# Patient Record
Sex: Male | Born: 1965 | Hispanic: No | Marital: Single | Smoking: Never smoker
Health system: Southern US, Community
[De-identification: ages and names within clinical notes are randomized; demographics above are authoritative.]

---

## 2015-09-14 ENCOUNTER — Encounter: Payer: Self-pay | Admitting: Emergency Medicine

## 2015-09-14 DIAGNOSIS — J0510 Acute epiglottitis without obstruction: Secondary | ICD-10-CM | POA: Diagnosis present

## 2015-09-14 DIAGNOSIS — R1319 Other dysphagia: Secondary | ICD-10-CM | POA: Diagnosis present

## 2015-09-14 DIAGNOSIS — B950 Streptococcus, group A, as the cause of diseases classified elsewhere: Secondary | ICD-10-CM | POA: Diagnosis present

## 2015-09-14 DIAGNOSIS — A419 Sepsis, unspecified organism: Principal | ICD-10-CM | POA: Diagnosis present

## 2015-09-14 DIAGNOSIS — Z8249 Family history of ischemic heart disease and other diseases of the circulatory system: Secondary | ICD-10-CM

## 2015-09-14 LAB — POCT RAPID STREP A: STREPTOCOCCUS, GROUP A SCREEN (DIRECT): NEGATIVE

## 2015-09-14 MED ORDER — ACETAMINOPHEN 500 MG PO TABS
ORAL_TABLET | ORAL | Status: AC
  Administered 2015-09-14: 1000 mg via ORAL
  Filled 2015-09-14: qty 2

## 2015-09-14 MED ORDER — ACETAMINOPHEN 500 MG PO TABS
1000.0000 mg | ORAL_TABLET | Freq: Once | ORAL | Status: AC
  Administered 2015-09-14: 1000 mg via ORAL

## 2015-09-14 NOTE — ED Notes (Signed)
Pt presents to ED with sore throat, hoarse voice, swelling to his tonsils. Pt states his tonsils are so enlarged and painful he feels like he can't swallow. Decrease in oral intake since onset of symptoms. Pt gagging and vomiting in triage. Pt states vomiting is due to the size of his tonsils and not due to stomach discomfort. Voice difficulty to understand. Also reports fever, body aches, and headache.

## 2015-09-15 ENCOUNTER — Encounter: Payer: Self-pay | Admitting: Radiology

## 2015-09-15 ENCOUNTER — Inpatient Hospital Stay
Admission: EM | Admit: 2015-09-15 | Discharge: 2015-09-17 | DRG: 872 | Disposition: A | Discharge status: Home / Self Care | Payer: Medicaid - Out of State | Attending: Internal Medicine | Admitting: Internal Medicine

## 2015-09-15 ENCOUNTER — Emergency Department: Payer: Medicaid - Out of State

## 2015-09-15 DIAGNOSIS — J0510 Acute epiglottitis without obstruction: Secondary | ICD-10-CM | POA: Diagnosis present

## 2015-09-15 DIAGNOSIS — A419 Sepsis, unspecified organism: Secondary | ICD-10-CM

## 2015-09-15 DIAGNOSIS — R1319 Other dysphagia: Secondary | ICD-10-CM | POA: Diagnosis present

## 2015-09-15 DIAGNOSIS — B950 Streptococcus, group A, as the cause of diseases classified elsewhere: Secondary | ICD-10-CM | POA: Diagnosis present

## 2015-09-15 DIAGNOSIS — Z8249 Family history of ischemic heart disease and other diseases of the circulatory system: Secondary | ICD-10-CM | POA: Diagnosis not present

## 2015-09-15 LAB — COMPREHENSIVE METABOLIC PANEL
ALBUMIN: 3.8 g/dL (ref 3.5–5.0)
ALT: 54 U/L (ref 17–63)
AST: 43 U/L — AB (ref 15–41)
Alkaline Phosphatase: 104 U/L (ref 38–126)
Anion gap: 10 (ref 5–15)
BUN: 17 mg/dL (ref 6–20)
CHLORIDE: 103 mmol/L (ref 101–111)
CO2: 23 mmol/L (ref 22–32)
Calcium: 8.8 mg/dL — ABNORMAL LOW (ref 8.9–10.3)
Creatinine, Ser: 0.99 mg/dL (ref 0.61–1.24)
GFR calc Af Amer: >60 mL/min (ref >60)
Glucose, Bld: 146 mg/dL — ABNORMAL HIGH (ref 65–99)
Potassium: 3.3 mmol/L — ABNORMAL LOW (ref 3.5–5.1)
Sodium: 136 mmol/L (ref 135–145)
Total Bilirubin: 2.3 mg/dL — ABNORMAL HIGH (ref 0.3–1.2)
Total Protein: 7.8 g/dL (ref 6.5–8.1)

## 2015-09-15 LAB — RAPID INFLUENZA A&B ANTIGENS (ARMC ONLY)
INFLUENZA A (ARMC): NOT DETECTED
INFLUENZA B (ARMC): NOT DETECTED

## 2015-09-15 LAB — CBC WITH DIFFERENTIAL/PLATELET
BASOS ABS: 0 10*3/uL (ref 0–0.1)
BASOS PCT: 0 %
EOS ABS: 0 10*3/uL (ref 0–0.7)
EOS PCT: 0 %
HCT: 42.9 % (ref 40.0–52.0)
Hemoglobin: 14.8 g/dL (ref 13.0–18.0)
LYMPHS PCT: 4 %
Lymphs Abs: 0.5 10*3/uL — ABNORMAL LOW (ref 1.0–3.6)
MCH: 29.6 pg (ref 26.0–34.0)
MCHC: 34.5 g/dL (ref 32.0–36.0)
MCV: 85.7 fL (ref 80.0–100.0)
Monocytes Absolute: 1.2 10*3/uL — ABNORMAL HIGH (ref 0.2–1.0)
Monocytes Relative: 8 %
Neutro Abs: 13.5 10*3/uL — ABNORMAL HIGH (ref 1.4–6.5)
Neutrophils Relative %: 88 %
PLATELETS: 190 10*3/uL (ref 150–440)
RBC: 5 MIL/uL (ref 4.40–5.90)
RDW: 13.4 % (ref 11.5–14.5)
WBC: 15.2 10*3/uL — AB (ref 3.8–10.6)

## 2015-09-15 LAB — LACTIC ACID, PLASMA: LACTIC ACID, VENOUS: 1.6 mmol/L (ref 0.5–2.0)

## 2015-09-15 LAB — SEDIMENTATION RATE: SED RATE: 53 mm/h — AB (ref 0–15)

## 2015-09-15 LAB — LIPASE, BLOOD: Lipase: 21 U/L (ref 11–51)

## 2015-09-15 MED ORDER — SODIUM CHLORIDE 0.9 % IV SOLN
3.0000 g | INTRAVENOUS | Status: AC
  Administered 2015-09-15: 3 g via INTRAVENOUS
  Filled 2015-09-15: qty 3

## 2015-09-15 MED ORDER — RACEPINEPHRINE HCL 2.25 % IN NEBU
INHALATION_SOLUTION | RESPIRATORY_TRACT | Status: AC
  Filled 2015-09-15: qty 0.5

## 2015-09-15 MED ORDER — ONDANSETRON HCL 4 MG PO TABS: 4.0000 mg | ORAL_TABLET | Freq: Four times a day (QID) | ORAL | Status: DC | PRN

## 2015-09-15 MED ORDER — SODIUM CHLORIDE 0.9 % IV BOLUS (SEPSIS)
2000.0000 mL | INTRAVENOUS | Status: AC
Start: 2015-09-15 — End: 2015-09-15
  Administered 2015-09-15: 2000 mL via INTRAVENOUS

## 2015-09-15 MED ORDER — INFLUENZA VAC SPLIT QUAD 0.5 ML IM SUSY
0.5000 mL | PREFILLED_SYRINGE | INTRAMUSCULAR | Status: AC
  Administered 2015-09-16: 0.5 mL via INTRAMUSCULAR
  Filled 2015-09-15: qty 0.5

## 2015-09-15 MED ORDER — HYDROCODONE-ACETAMINOPHEN 5-325 MG PO TABS
1.0000 | ORAL_TABLET | ORAL | Status: DC | PRN
  Filled 2015-09-15: qty 2

## 2015-09-15 MED ORDER — ENOXAPARIN SODIUM 40 MG/0.4ML ~~LOC~~ SOLN
40.0000 mg | SUBCUTANEOUS | Status: DC
  Administered 2015-09-15 – 2015-09-16 (×2): 40 mg via SUBCUTANEOUS
  Filled 2015-09-15 (×2): qty 0.4

## 2015-09-15 MED ORDER — DEXAMETHASONE SODIUM PHOSPHATE 10 MG/ML IJ SOLN
INTRAMUSCULAR | Status: AC
  Administered 2015-09-15: 10 mg via INTRAVENOUS
  Filled 2015-09-15: qty 1

## 2015-09-15 MED ORDER — ACETAMINOPHEN 500 MG PO TABS
1000.0000 mg | ORAL_TABLET | Freq: Once | ORAL | Status: AC
  Administered 2015-09-15: 1000 mg via ORAL
  Filled 2015-09-15: qty 2

## 2015-09-15 MED ORDER — ACETAMINOPHEN 325 MG PO TABS: 650.0000 mg | ORAL_TABLET | Freq: Once | ORAL | Status: DC

## 2015-09-15 MED ORDER — SODIUM CHLORIDE 0.9 % IV SOLN
3.0000 g | Freq: Four times a day (QID) | INTRAVENOUS | Status: DC
  Administered 2015-09-15 – 2015-09-17 (×6): 3 g via INTRAVENOUS
  Filled 2015-09-15 (×9): qty 3

## 2015-09-15 MED ORDER — RACEPINEPHRINE HCL 2.25 % IN NEBU
0.5000 mL | INHALATION_SOLUTION | RESPIRATORY_TRACT | Status: AC
  Administered 2015-09-15: 0.5 mL via RESPIRATORY_TRACT
  Filled 2015-09-15: qty 0.5

## 2015-09-15 MED ORDER — POTASSIUM CHLORIDE 10 MEQ/100ML IV SOLN
10.0000 meq | INTRAVENOUS | Status: AC
  Administered 2015-09-15 (×3): 10 meq via INTRAVENOUS
  Filled 2015-09-15 (×3): qty 100

## 2015-09-15 MED ORDER — DEXAMETHASONE SODIUM PHOSPHATE 10 MG/ML IJ SOLN
10.0000 mg | Freq: Four times a day (QID) | INTRAMUSCULAR | Status: DC
  Administered 2015-09-15 – 2015-09-16 (×7): 10 mg via INTRAVENOUS
  Filled 2015-09-15 (×8): qty 1

## 2015-09-15 MED ORDER — MORPHINE SULFATE (PF) 4 MG/ML IV SOLN
4.0000 mg | Freq: Once | INTRAVENOUS | Status: AC
  Administered 2015-09-15: 4 mg via INTRAVENOUS
  Filled 2015-09-15: qty 1

## 2015-09-15 MED ORDER — BISACODYL 5 MG PO TBEC: 5.0000 mg | DELAYED_RELEASE_TABLET | Freq: Every day | ORAL | Status: DC | PRN

## 2015-09-15 MED ORDER — SODIUM CHLORIDE 0.9 % IV SOLN
Freq: Once | INTRAVENOUS | Status: AC
  Administered 2015-09-15: 12:00:00 via INTRAVENOUS

## 2015-09-15 MED ORDER — SODIUM CHLORIDE 0.9 % IV SOLN
3.0000 g | Freq: Three times a day (TID) | INTRAVENOUS | Status: DC
  Administered 2015-09-15: 3 g via INTRAVENOUS
  Filled 2015-09-15 (×3): qty 3

## 2015-09-15 MED ORDER — ACETAMINOPHEN 325 MG PO TABS
650.0000 mg | ORAL_TABLET | Freq: Four times a day (QID) | ORAL | Status: DC | PRN
  Administered 2015-09-15: 650 mg via ORAL
  Filled 2015-09-15: qty 2

## 2015-09-15 MED ORDER — SODIUM CHLORIDE 0.9 % IV SOLN
Freq: Once | INTRAVENOUS | Status: AC
  Administered 2015-09-15: 09:00:00 via INTRAVENOUS

## 2015-09-15 MED ORDER — DEXAMETHASONE SODIUM PHOSPHATE 10 MG/ML IJ SOLN
10.0000 mg | Freq: Once | INTRAMUSCULAR | Status: AC
  Administered 2015-09-15: 10 mg via INTRAVENOUS

## 2015-09-15 MED ORDER — MORPHINE SULFATE (PF) 2 MG/ML IV SOLN
2.0000 mg | INTRAVENOUS | Status: DC | PRN
  Administered 2015-09-16 (×4): 2 mg via INTRAVENOUS
  Filled 2015-09-15 (×4): qty 1

## 2015-09-15 MED ORDER — SENNOSIDES-DOCUSATE SODIUM 8.6-50 MG PO TABS: 1.0000 | ORAL_TABLET | Freq: Every evening | ORAL | Status: DC | PRN

## 2015-09-15 MED ORDER — IOHEXOL 300 MG/ML  SOLN
75.0000 mL | Freq: Once | INTRAMUSCULAR | Status: AC | PRN
  Administered 2015-09-15: 75 mL via INTRAVENOUS

## 2015-09-15 MED ORDER — ONDANSETRON HCL 4 MG/2ML IJ SOLN
4.0000 mg | Freq: Once | INTRAMUSCULAR | Status: AC
Start: 2015-09-15 — End: 2015-09-15
  Administered 2015-09-15: 4 mg via INTRAVENOUS
  Filled 2015-09-15: qty 2

## 2015-09-15 MED ORDER — ACETAMINOPHEN 650 MG RE SUPP: 650.0000 mg | Freq: Four times a day (QID) | RECTAL | Status: DC | PRN

## 2015-09-15 MED ORDER — ONDANSETRON HCL 4 MG/2ML IJ SOLN
4.0000 mg | Freq: Four times a day (QID) | INTRAMUSCULAR | Status: DC | PRN
  Administered 2015-09-16 (×2): 4 mg via INTRAVENOUS
  Filled 2015-09-15 (×2): qty 2

## 2015-09-15 NOTE — ED Notes (Signed)
ENT at bedside

## 2015-09-15 NOTE — Progress Notes (Signed)
RN spoke with pt's sister and provided an update on pt's status and plan of care. Pt's sister given password for future calls. Pt's sister will call and update pt's wife, who speaks little [place].

## 2015-09-15 NOTE — Op Note (Signed)
09/15/2015  7:41 AM    [name]  [id number]   Pre-Op Dx:  Possible Epiglottitis  Post-op Dx: Epiglottitis involving the right side of the epiglottis and aryepiglottic fold  Proc: Flexible laryngoscopy   Surg:  Adelise Buswell H  Anes:  GOT  EBL:  None  Comp:  None  Findings:  Swelling or redness on the right side epiglottis and right aryepiglottic fold with exudate in the right piriform sinus. Can see the left cord moving well and and airway there.  Procedure: The patient was given topical anesthesia by spraying the nose with phenylephrine and Xylocaine. The flexible scope was passed through the right nostril visualize the hypopharynx and larynx. He showed previous nasal and septal surgery. The nose and nasopharynx were clear. The base of tongue was clear. The epiglottis was read on the right side and going down the aryepiglottic fold. There is some yellow brown mucus line of the right piriform sinus which she continue cough up. He had good mobility of his left vocal cord to be easily seen while the right side was covered over by some swelling of the aryepiglottic fold. No sign of any air hunger or rapid breathing. No other redness or swelling noted elsewhere.  Dispo:   Patient was seen in the ER, and will be admitted to the ICU  Plan:  He is on IV antibiotics and IV Decadron and used racemic epi if necessary. His swelling should settle down and once there is no further redness the epiglottis then he can be discharged home on oral antibiotics.  Raman Featherston H  09/15/2015 7:41 AM

## 2015-09-15 NOTE — Progress Notes (Signed)
Transfer  Pt transferred to ICU for observation. Report given to RN. Pt and meds transferred in bed with transporter assistance.

## 2015-09-15 NOTE — Progress Notes (Signed)
Pharmacy Antibiotic Note  Joseph Mckinney is a 50 y.o. male admitted on 09/15/2015 with sepsis secondary to severe epiglottitis.  Pharmacy has been consulted for Unasyn dosing.  Plan: Patient currently ordered Unasyn 3 g iv q 8 hours. Renal function will accommodate dosing of 3 g iv q 6 hours so will adjust accordingly.   Pharmacy will continue to monitor and adjust per consult.   Height:  (172.7 cm) Weight: 190 lb (86.183 kg) IBW/kg (Calculated) : 68.4  Temp (24hrs), Avg:99.6 F (37.6 C), Min:97.9 F (36.6 C), Max:103 F (39.4 C)   Recent Labs Lab 09/15/15 0426  WBC 15.2*  CREATININE 0.99  LATICACIDVEN 1.6    Estimated Creatinine Clearance: 96.4 mL/min (by C-G formula based on Cr of 0.99).    No Known Allergies  Antimicrobials this admission: Unasyn 02/21  >>    Dose adjustments this admission: Unasyn dose increased 02/21  Microbiology results: 02/21 BCx: No growth, pending 02/21 Rapid flu: negative 02/20 GAS: pending    Thank you for allowing pharmacy to be a part of this patient's care.  Valentina Gu 09/15/2015 2:43 PM

## 2015-09-15 NOTE — ED Notes (Signed)
Pt returned to room  

## 2015-09-15 NOTE — ED Notes (Signed)
Pt uprite on stretcher in exam room, voice hoarse, frequent gagging and spitting; Pt reports x 2 days having sinus congestion, sore throat, fever and prod cough green sputum; taking advil without relief; resp even/unlab, lungs clear

## 2015-09-15 NOTE — ED Notes (Signed)
Plan to admit pt to hospital. Awaiting bed assignment and admission orders.

## 2015-09-15 NOTE — Progress Notes (Signed)
Joseph Mckinney, Joseph Mckinney [birth year] Enedina Finner, MD  Reason for Reevaluation: Reevaluation of his laryngeal airway now 12 hours later. Has epiglottitis.  : The patient has been watched closely during the daytime. He still is coughing up purulent sputum periodically. He feels pain is significantly improved and he is not having any shortness of breath. It still hurts to swallow but is now 6 out of 10.  Allergies: No Known Allergies  ROS: Review of systems normal other than 12 systems except per HPI.  PMH: History reviewed. No pertinent past medical history.  FH:  Family History  Problem Relation Age of Onset  . Hypertension Mother     SH:  Social History   Social History  . Marital Status: Single    Spouse Name: N/A  . Number of Children: N/A  . Years of Education: N/A   Occupational History  . Not on file.   Social History Main Topics  . Smoking status: Never Smoker   . Smokeless tobacco: Not on file  . Alcohol Use: No  . Drug Use: Not on file  . Sexual Activity: Not on file   Other Topics Concern  . Not on file   Social History Narrative    PSH: History reviewed. No pertinent past surgical history.  Physical  Exam: Patient is resting quietly in the ICU. His head is elevated. He can localize although he is in a falsetto voice because sees quite hoarse if he just tries to talk in his normal voice. Oral cavity, lips, gums, ororpharynx normal with no masses or lesions. Skin warm and dry. Nasal cavity without polyps or purulence. External nose and ears without masses or lesions. Neck supple with no masses or lesions. No lymphadenopathy palpated. Thyroid normal with no masses.  Flexible laryngoscopy was done again and dictated in detail elsewhere. Shows resolution of most of the swelling of the epiglottis although it still is quite teary red. There is a white exudate over the arytenoids on both sides. The vocal cords are slightly pink. Has good mobility of the cords and open  airway and no sign for obstruction.   A/P: Epiglottitis that is improving. He is very little risk of swelling that would cause airway obstruction at this time. he'll remain on IV antibiotics and Decadron tonight.  However, if his swallowing is improving, he potentially can be switched over to an oral prednisone taper tomorrow along with Augmentin orally. If he continues to improve then he should be able to be discharged home on Thursday morning. If he is not well enough for orals tomorrow then he probably should remain on IV medications for one more day before switching to oral meds. Continue with voice rest for now until some of the inflammation settles down. Do not plan on placing the scope in his larynx again at this point as long as he continues to improve. If there should be some change in his progress and he starts to deteriorate, then he should be evaluated again at that time by ENT. Signing off   Suella Cogar H 09/15/2015 7:15 PM

## 2015-09-15 NOTE — ED Notes (Signed)
Pt to CT via stretcher accomp by CT tech 

## 2015-09-15 NOTE — Consult Note (Signed)
Taryll, Reichenberger [id number] June 09, 1966 Loleta Rose, MD  Reason for Consult: Evaluate the larynx because of possible epiglottitis  HPI: Patient is a 50 year old [place] male who started with sore throat 5 days ago. It slowly progressed got much worse through the night. He has not been short of breath but has had MUCUS coming out from his throat that he has to cough up feels like she is retching septum. Taken ibuprofen she has not been solving the pain. Now been on any antibiotics. A CT scan of his larynx showed swelling of his epiglottis and a 7 mm airway. He is been very achy and sore throats body. Assessment was made of his airway is of potential epiglottitis to see if he needs emergent intubation.  Allergies: No Known Allergies  ROS: Review of systems normal other than 12 systems except per HPI.  PMH: History reviewed. No pertinent past medical history.  FH: No family history on file.  SH:  Social History   Social History  . Marital Status: Single    Spouse Name: N/A  . Number of Children: N/A  . Years of Education: N/A   Occupational History  . Not on file.   Social History Main Topics  . Smoking status: Never Smoker   . Smokeless tobacco: Not on file  . Alcohol Use: No  . Drug Use: Not on file  . Sexual Activity: Not on file   Other Topics Concern  . Not on file   Social History Narrative    PSH: No past surgical history on file.  Physical  Exam: Well-developed well-nourished white male in no respiratory distress. He is perspiring some. It hurts for him to swallow or cough of purulent mucus. CN 2-12 grossly intact and symmetric. Oral cavity, lips, gums, ororpharynx normal with no masses or lesions. Skin warm and dry. Nasal cavity without polyps or purulence. External nose and ears without masses or lesions.  Neck supple with no masses or lesions. No lymphadenopathy palpated.   Flexible laryngoscopy was done and dictated in detail elsewhere. This shows swelling of the right  side of the epiglottis but not the left. He has swelling in his right area epiglottic fold but does not show impending obstruction of his laryngeal airway.   A/P: Patient has adult epiglottitis that involving a portion of the subglottis but not enough to be swelling his larynx closed. He has now started on IV Decadron and IV antibiotics which should help turn this around we'll reevaluate him again in 12 hours to see if there is decreased swelling of his larynx. Once the swelling is fairly well subsided then he can be discharged home on oral antibiotics and a prednisone taper. He should remain on Decadron IV and IV antibiotics until then. He will be admitted to the ICU followed by the intensivist to make sure his airway issues did not worsen. This is been explained to the patient and discussed with Dr. Manson Passey in the emergency room   Yukie Bergeron H 09/15/2015 7:45 AM

## 2015-09-15 NOTE — Progress Notes (Signed)
Confirmed with Dr. Anne Hahn that he does in fact want the pt. to be transferred to Telemetry.

## 2015-09-15 NOTE — Op Note (Signed)
09/15/2015  7:11 PM    [name]  [id number]   Pre-Op Dx:  Epiglottitis  Post-op Dx: Epiglottitis, improved from 12 hours ago  Proc: Flexible laryngoscopy   Surg:  [name]  Anes:  GOT  EBL:  None  Comp:  None  Findings:  Nose the swelling of the epiglottis is down except for the right tip. This is still cherry-red blood is much improved. There is a whitish exudate over both arytenoids. You can now see both vocal cords and they move well but are somewhat pink. No swelling of the cords  Procedure: The nose is sprayed with 4% Xylocaine mixed with Afrin for vasoconstriction and anesthesia. The flexible scope was passed through the right nostril to visualize the hypopharynx and larynx. The nasopharynx were quite red. The hypopharynx showed the epiglottis swelling to be mostly gone with still a very red tip on the right side the epiglottis. The arytenoids had a white exudate over both of them, but were not significantly swollen. Vocal cords were pink but moved well and there was no swelling. The airway was clear.  Dispo:   Evaluated in the ICU at the bedside  Plan:  Is improving significantly with the current medications and should not need further flexible endoscopy.  [name]  09/15/2015 7:11 PM

## 2015-09-15 NOTE — H&P (Addendum)
[facility] [place] [facility]   PATIENT NAME: Joseph Mckinney    MR#:  [id number]  DATE OF BIRTH:  1966/04/20  DATE OF ADMISSION:  09/15/2015  PRIMARY CARE PHYSICIAN: No PCP Per Patient   REQUESTING/REFERRING PHYSICIAN: Dr. Manson Passey  CHIEF COMPLAINT:  Severe difficulty swallowing, throat pain, fever  HISTORY OF PRESENT ILLNESS:  Joseph Mckinney  is a 50 y.o. 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 Patient was evaluated in the emergency room and was found to have severe right epiglottitis. He is started on IV Unasyn and IV Decadron. ENT recommends patient be admitted to ICU for close monitoring first 12-18 hours in case he develops impending respiratory failure and requires intubation  PAST MEDICAL HISTORY:  History reviewed. No pertinent past medical history.  PAST SURGICAL HISTOIRY:  History reviewed. No pertinent past surgical history.  SOCIAL HISTORY:   Social History  Substance Use Topics  . Smoking status: Never Smoker   . Smokeless tobacco: Not on file  . Alcohol Use: No    FAMILY HISTORY:   Family History  Problem Relation Age of Onset  . Hypertension Mother     DRUG ALLERGIES:  No Known Allergies  REVIEW OF SYSTEMS:  Review of Systems  Constitutional: Positive for fever. Negative for chills and diaphoresis.  HENT: Positive for sore throat. Negative for congestion, ear pain, hearing loss and nosebleeds.   Eyes: Negative for blurred vision, double vision, photophobia and pain.  Respiratory: Positive for stridor. Negative for hemoptysis, sputum production and wheezing.   Cardiovascular: Negative for orthopnea, claudication and leg swelling.  Gastrointestinal: Negative for heartburn and abdominal pain.  Genitourinary: Negative for dysuria and frequency.  Musculoskeletal: Negative for back  pain, joint pain and neck pain.  Skin: Negative for rash.  Neurological: Positive for weakness. Negative for tingling, sensory change, speech change, focal weakness, seizures and headaches.  Endo/Heme/Allergies: Does not bruise/bleed easily.  Psychiatric/Behavioral: Negative for suicidal ideas, memory loss and substance abuse. The patient is not nervous/anxious.   All other systems reviewed and are negative.    MEDICATIONS AT HOME:   Prior to Admission medications   Not on File      VITAL SIGNS:  Blood pressure 128/91, pulse 100, temperature 97.9 F (36.6 C), temperature source Oral, resp. rate 17, height 5\' 8"  (1.727 m), weight 86.183 kg (190 lb), SpO2 99 %.  PHYSICAL EXAMINATION:  GENERAL:  50 y.o.-year-old patient lying in the bed with no acute distress.  EYES: Pupils equal, round, reactive to light and accommodation. No scleral icterus. Extraocular muscles intact.  HEENT: Head atraumatic, normocephalic. Oral mucosa moist. NECK:  Supple, no jugular venous distention. No thyroid enlargement, no tenderness.  LUNGS: Normal breath sounds bilaterally, no wheezing, rales,rhonchi or crepitation. No use of accessory muscles of respiration.  CARDIOVASCULAR: S1, S2 normal. No murmurs, rubs, or gallops.  ABDOMEN: Soft, nontender, nondistended. Bowel sounds present. No organomegaly or mass.  EXTREMITIES: No pedal edema, cyanosis, or clubbing.  NEUROLOGIC: Cranial nerves II through XII are intact. Muscle strength 5/5 in all extremities. Sensation intact. Gait not checked.  PSYCHIATRIC: The patient is alert and oriented x 3.  SKIN: No obvious rash, lesion, or ulcer.   LABORATORY PANEL:   CBC  Recent Labs Lab 09/15/15 0426  WBC 15.2*  HGB 14.8  HCT 42.9  PLT 190   ------------------------------------------------------------------------------------------------------------------  Chemistries   Recent Labs Lab 09/15/15 0426  NA 136  K 3.3*  CL 103  CO2 23  GLUCOSE 146*  BUN  17  CREATININE 0.99  CALCIUM 8.8*  AST 43*  ALT 54  ALKPHOS 104  BILITOT 2.3*   ------------------------------------------------------------------------------------------------------------------  Cardiac Enzymes No results for input(s): TROPONINI in the last 168 hours. ------------------------------------------------------------------------------------------------------------------  RADIOLOGY:  Ct Soft Tissue Neck W Contrast  09/15/2015  CLINICAL DATA:  Initial evaluation for severe sore throat, hoarse flow is, neck pain, fever. EXAM: CT NECK WITH CONTRAST TECHNIQUE: Multidetector CT imaging of the neck was performed using the standard protocol following the bolus administration of intravenous contrast. CONTRAST:  75mL OMNIPAQUE IOHEXOL 300 MG/ML  SOLN COMPARISON:  None available. FINDINGS: Visualized portions of the brain are normal in appearance without acute abnormality. Partially visualized orbits within normal limits. Mild-to-moderate mucosal thickening within the max O sinuses and ethmoidal air cells, likely inflammatory in nature. Visualized mastoid air cells are clear. Middle ear cavities are well pneumatized. Salivary glands including the parotid glands and submandibular glands within normal limits. Oral cavity demonstrates no acute abnormality. No acute abnormality about the dentition. Palatine tonsils limb cells within normal limits. Scattered calcified tonsilliths noted. Parapharyngeal fat grossly preserved. Nasopharynx within normal limits. There is prominent asymmetric mucosal and submucosal edema throughout the right supraglottic larynx and hypopharynx, extending from the level of the epiglottis inferiorly to the level of the false cord/laryngeal ventricle. The epiglottis itself is enlarged and edematous. Right aryepiglottic fold is markedly enlarged and edematous as well. The right piriform sinus is effaced. Edema extends into the pre epiglottic fat. There is mass effect on the  supraglottic airway which is narrowed and slightly shifted to the left. Findings are consistent with acute supraglottis/pharyngitis. Supraglottic airway measures approximately 7 mm at its most narrow point. Minimal mass effect on the right true cord due to the inflammatory changes. True cords are otherwise within normal limits. The subglottic airway is widely clear. No drainable fluid collection or abscess. Thyroid gland normal. Mildly enlarged right level 2 lymph nodes measure up to 14 mm, likely reactive. Right level 3 node measures at the upper limits of normal at 1 cm. Mild inflammatory stranding along the jugular chain due to lateral extension from the inflammatory process within the pharynx. Visualized superior mediastinum demonstrates no acute abnormality. Layering debris within the visualized upper esophagus. Aberrant right subclavian artery noted. Normal intravascular enhancement seen throughout the neck. No acute osseous abnormality. No worrisome lytic or blastic osseous lesion. Visualized lungs are clear. IMPRESSION: 1. Extensive edema involving the epiglottis, right aryepiglottic fold, and right supraglottic larynx and hypopharynx as above, consistent with acute supraglottis/pharyngitis. Secondary mass effect on the or pharyngeal airway which is narrowed to 7 mm at its most narrow point. No abscess or drainable fluid collection identified. 2. Mildly enlarged right-sided cervical adenopathy, likely reactive. 3. Aberrant right subclavian artery. 4. Layering debris within the upper esophageal lumen. Finding suggests this patient may be at risk for aspiration. 5. Mild-to-moderate inflammatory paranasal sinus disease as above. Results were called by telephone at the time of interpretation on 09/15/2015 at 6:25 am to the charge nurse in the emergency room, Devoria Albe RN who verbally acknowledged these results. Electronically Signed   By: Rise Mu M.D.   On: 09/15/2015 06:28    EKG:     IMPRESSION AND PLAN:   Joseph Mckinney  is a 50 y.o. 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 Patient was evaluated in the emergency room and was found to have severe right epiglottitis. H  1. Sepsis secondary to Acute right-sided severe epiglottitis -Patient came in with high-grade fever of 101.2 tachycardia and elevated white count meets criteria for sepsis -Admit to ICU per ENT recommendation for close monitoring for possible impending respiratory failure and need for intubation -IV Decadron and Unasyn -Nothing by mouth -When necessary morphine -Strep throat negative  2. Severe dysphagia secondary to #1 Once cleared by ENT. Clear liquid diet  3. DVT prophylaxis subcutaneous Lovenox  All the records are reviewed and case discussed with ED provider. Management plans discussed with the patient, family and they are in agreement.  CODE STATUS: Full code  TOTAL CRITICAL TIME TAKING CARE OF THIS PATIENT: 50 minutes.    Joseph Mckinney M.D on 09/15/2015 at 1:34 PM  Between 7am to 6pm - Pager - [phone]  After 6pm go to [email or web address] - password [email or web address] [facility]  [facility]  Office  [phone]  CC: Primary care physician; No PCP Per Patient

## 2015-09-15 NOTE — ED Notes (Signed)
Dr Allena Katz in to see pt at this time. Tried to phone pts sister who lives in [place] and with the time difference, unable to reach by phone at this time.   Pt sister: Jola Schmidt [phone].

## 2015-09-15 NOTE — ED Notes (Signed)
Pt resting at this time, no acute distress noted. Cont to monitor. Vss.

## 2015-09-15 NOTE — Progress Notes (Signed)
Pt arrived on unit at 1840. Pt was in stable condition. No complaints of pain. Lungs sounds are rhonchus in all fields.  Report given to [facility], on coming nurse.

## 2015-09-15 NOTE — Progress Notes (Signed)
Admission  Pt arrived to unit via stretcher and was able to ambulate to bed. Pt has no complaints of pain at this time, but has a productive cough with thick, frothy sputum. Pt was able to participate in admission process and assign a password. In relation to patient condition, pt will be transferred to ICU for closer monitoring. This RN will call and update sister of pt's condition and pending transfer, per pt request.   Report received from ED RN, Maxine Glenn.

## 2015-09-15 NOTE — ED Provider Notes (Signed)
[facility] Emergency Department Provider Note  ____________________________________________  Time seen: Approximately 4:33 AM  I have reviewed the triage vital signs and the nursing notes.   HISTORY  Chief Complaint Sore Throat; Fever; and Headache    HPI Joseph Mckinney is a 50 y.o. male with no significant past medical history who presents with 2 days of gradual onset fever, sore throat, vomiting, and pain in the right side of his neck.  He reports that it has gotten worse over time and he is in moderate distress upon arrival to the emergency department.  He feels like it is difficult to swallow and he has been eating and drinking less than usual.  He had a fever of 103 in triage and a heart rate in the 130s.  He has been taking ibuprofen which has not been helping.  Nothing in particular is making worse but is getting worse over time.  He describes his symptoms as severe.  He has not had an influenza vaccination this year.  He also complains of severe whole body myalgias, fatigue, vomiting, and some nonspecific and intermittent pain in his abdomen.   History reviewed. No pertinent past medical history.  There are no active problems to display for this patient.   No past surgical history on file.  No current outpatient prescriptions on file.  Allergies Review of patient's allergies indicates no known allergies.  No family history on file.  Social History Social History  Substance Use Topics  . Smoking status: Never Smoker   . Smokeless tobacco: None  . Alcohol Use: No    Review of Systems Constitutional: Fever to 103 with chills Eyes: No visual changes. ENT: Ears sore throat and pain in the right side of his neck Cardiovascular: Denies chest pain. Respiratory: Mild shortness of breath with frequent productive cough Gastrointestinal: Mild intermittent abdominal pain with several episodes of vomiting.  No diarrhea.  No constipation. Genitourinary:  Negative for dysuria. Musculoskeletal: Negative for back pain. Skin: Negative for rash. Neurological: Negative for headaches, focal weakness or numbness.  10-point ROS otherwise negative.  ____________________________________________   PHYSICAL EXAM:  VITAL SIGNS: ED Triage Vitals  Enc Vitals Group     BP 09/14/15 2240 146/99 mmHg     Pulse Rate 09/14/15 2240 138     Resp 09/14/15 2240 24     Temp 09/14/15 2240 103 F (39.4 C)     Temp Source 09/14/15 2240 Oral     SpO2 09/14/15 2240 95 %     Weight 09/14/15 2240 190 lb (86.183 kg)     Height 09/14/15 2240  (1.727 m)     Head Cir --      Peak Flow --      Pain Score 09/14/15 2241 10     Pain Loc --      Pain Edu? --      Excl. in GC? --     Constitutional: Alert and oriented.  Ill appearing and in moderate distress, eating up in bed and complaining of sore throat and frequent gagging. Eyes: Conjunctivae are normal. PERRL. EOMI. Head: Atraumatic. Nose: No congestion/rhinnorhea. Mouth/Throat: Mucous membranes are dry.  Oropharynx erythematous.  Exam limited by the patient's distress and frequent gagging whenever he opens his mouth.  Patient has a very hoarse and soft voice which is different than usual. Neck: No stridor.  Tender to palpation of the right side of his neck with no fluctuance or induration palpable Cardiovascular: Tachycardic in the 130s to  140s, regular rhythm. Grossly normal heart sounds.  Good peripheral circulation. Respiratory: Normal respiratory effort.  No retractions. Lungs CTAB. Gastrointestinal: Soft and nontender. No distention. No abdominal bruits. No CVA tenderness. Musculoskeletal: No lower extremity tenderness nor edema.  No joint effusions. Neurologic:  No gross focal neurologic deficits are appreciated.  Skin:  Skin is warm, dry and intact. No rash noted.   ____________________________________________   LABS (all labs ordered are listed, but only abnormal results are  displayed)  Labs Reviewed  COMPREHENSIVE METABOLIC PANEL - Abnormal; Notable for the following:    Potassium 3.3 (*)    Glucose, Bld 146 (*)    Calcium 8.8 (*)    AST 43 (*)    Total Bilirubin 2.3 (*)    All other components within normal limits  SEDIMENTATION RATE - Abnormal; Notable for the following:    Sed Rate 53 (*)    All other components within normal limits  CBC WITH DIFFERENTIAL/PLATELET - Abnormal; Notable for the following:    WBC 15.2 (*)    Neutro Abs 13.5 (*)    Lymphs Abs 0.5 (*)    Monocytes Absolute 1.2 (*)    All other components within normal limits  RAPID INFLUENZA A&B ANTIGENS ([facility] ONLY)  CULTURE, BLOOD (ROUTINE X 2)  CULTURE, GROUP A STREP ([facility])  CULTURE, BLOOD (ROUTINE X 2)  LACTIC ACID, PLASMA  LIPASE, BLOOD  LACTIC ACID, PLASMA  POCT RAPID STREP A   ____________________________________________  EKG  None ____________________________________________  RADIOLOGY   Ct Soft Tissue Neck W Contrast  09/15/2015  CLINICAL DATA:  Initial evaluation for severe sore throat, hoarse flow is, neck pain, fever. EXAM: CT NECK WITH CONTRAST TECHNIQUE: Multidetector CT imaging of the neck was performed using the standard protocol following the bolus administration of intravenous contrast. CONTRAST:  75mL OMNIPAQUE IOHEXOL 300 MG/ML  SOLN COMPARISON:  None available. FINDINGS: Visualized portions of the brain are normal in appearance without acute abnormality. Partially visualized orbits within normal limits. Mild-to-moderate mucosal thickening within the max O sinuses and ethmoidal air cells, likely inflammatory in nature. Visualized mastoid air cells are clear. Middle ear cavities are well pneumatized. Salivary glands including the parotid glands and submandibular glands within normal limits. Oral cavity demonstrates no acute abnormality. No acute abnormality about the dentition. Palatine tonsils limb cells within normal limits. Scattered calcified tonsilliths noted.  Parapharyngeal fat grossly preserved. Nasopharynx within normal limits. There is prominent asymmetric mucosal and submucosal edema throughout the right supraglottic larynx and hypopharynx, extending from the level of the epiglottis inferiorly to the level of the false cord/laryngeal ventricle. The epiglottis itself is enlarged and edematous. Right aryepiglottic fold is markedly enlarged and edematous as well. The right piriform sinus is effaced. Edema extends into the pre epiglottic fat. There is mass effect on the supraglottic airway which is narrowed and slightly shifted to the left. Findings are consistent with acute supraglottis/pharyngitis. Supraglottic airway measures approximately 7 mm at its most narrow point. Minimal mass effect on the right true cord due to the inflammatory changes. True cords are otherwise within normal limits. The subglottic airway is widely clear. No drainable fluid collection or abscess. Thyroid gland normal. Mildly enlarged right level 2 lymph nodes measure up to 14 mm, likely reactive. Right level 3 node measures at the upper limits of normal at 1 cm. Mild inflammatory stranding along the jugular chain due to lateral extension from the inflammatory process within the pharynx. Visualized superior mediastinum demonstrates no acute abnormality. Layering debris within the  visualized upper esophagus. Aberrant right subclavian artery noted. Normal intravascular enhancement seen throughout the neck. No acute osseous abnormality. No worrisome lytic or blastic osseous lesion. Visualized lungs are clear. IMPRESSION: 1. Extensive edema involving the epiglottis, right aryepiglottic fold, and right supraglottic larynx and hypopharynx as above, consistent with acute supraglottis/pharyngitis. Secondary mass effect on the or pharyngeal airway which is narrowed to 7 mm at its most narrow point. No abscess or drainable fluid collection identified. 2. Mildly enlarged right-sided cervical adenopathy,  likely reactive. 3. Aberrant right subclavian artery. 4. Layering debris within the upper esophageal lumen. Finding suggests this patient may be at risk for aspiration. 5. Mild-to-moderate inflammatory paranasal sinus disease as above. Results were called by telephone at the time of interpretation on 09/15/2015 at 6:25 am to the charge nurse in the emergency room, Devoria Albe RN who verbally acknowledged these results. Electronically Signed   By: Rise Mu M.D.   On: 09/15/2015 06:28    ____________________________________________   PROCEDURES  Procedure(s) performed: None  Critical Care performed: Yes, see critical care note(s)   CRITICAL CARE Performed by: Loleta Rose   Total critical care time: 45 minutes  Critical care time was exclusive of separately billable procedures and treating other patients.  Critical care was necessary to treat or prevent imminent or life-threatening deterioration.  Critical care was time spent personally by me on the following activities: development of treatment plan with patient and/or surrogate as well as nursing, discussions with consultants, evaluation of patient's response to treatment, examination of patient, obtaining history from patient or surrogate, ordering and performing treatments and interventions, ordering and review of laboratory studies, ordering and review of radiographic studies, pulse oximetry and re-evaluation of patient's condition.  ____________________________________________   INITIAL IMPRESSION / ASSESSMENT AND PLAN / ED COURSE  Pertinent labs & imaging results that were available during my care of the patient were reviewed by me and considered in my medical decision making (see chart for details).  Patient is very ill-appearing upon my evaluation shortly after his placement in an exam room.  I am ordering a septic protocol lab work and empiric antibiotics (Unasyn 3 g IV) but he does not have any altered mental  status and I am not yet calling an official code sepsis as this may all represent an influenza.  However I am going to evaluate broadly including a CT scan of his neck with IV contrast given my concern for a deep tissue infection or even epiglottitis given his current presentation.  He is currently protecting his airway that he is frequently spitting; it is difficult to tell if it is because he is having trouble swallowing or is because his throat is extremely sore.  I am also giving morphine 4 mg and Zofran 4 mg.  ----------------------------------------- 6:31 AM on 09/15/2015 -----------------------------------------  The radiologist called with the report that the patient has extensive edema, epiglottitis, and his airway is very tight.  There is no indication of abscess.  I am giving Decadron 10 mg IV right now and have ordered a racemic epi nebulizer treatment.  He has already gotten Unasyn 3 g IV.  I have placed a page to the ENT doctor to discuss.  ----------------------------------------- 7:15 AM on 09/15/2015 -----------------------------------------  I spoke with phone most immediately after paging him with Dr. Elenore Rota who agreed with my management options and thus far just arrived to the emergency department to evaluate the patient in person and decide whether he needs emergent airway intervention.  ____________________________________________  FINAL CLINICAL IMPRESSION(S) / ED DIAGNOSES  Final diagnoses:  Epiglottitis  Sepsis, due to unspecified organism [facility])      NEW MEDICATIONS STARTED DURING THIS VISIT:  New Prescriptions   No medications on file     Loleta Rose, MD 09/15/15 [phone]

## 2015-09-16 LAB — BASIC METABOLIC PANEL
Anion gap: 9 (ref 5–15)
BUN: 20 mg/dL (ref 6–20)
CALCIUM: 8.8 mg/dL — AB (ref 8.9–10.3)
CO2: 27 mmol/L (ref 22–32)
Chloride: 106 mmol/L (ref 101–111)
Creatinine, Ser: 0.87 mg/dL (ref 0.61–1.24)
GFR calc Af Amer: >60 mL/min (ref >60)
GLUCOSE: 169 mg/dL — AB (ref 65–99)
POTASSIUM: 3.6 mmol/L (ref 3.5–5.1)
SODIUM: 142 mmol/L (ref 135–145)

## 2015-09-16 LAB — CULTURE, GROUP A STREP (THRC)

## 2015-09-16 NOTE — Progress Notes (Signed)
Patient ID: Joseph Mckinney, male   DOB: 11-30-1965, 50 y.o.   MRN: [id number] [facility]   PATIENT NAME: Joseph Mckinney    MR#:  [id number]  DATE OF BIRTH:  06/22/1966  SUBJECTIVE:  Feels a lot better today. Able to swallow and tolerate clear liquid diet. wants try soft food. Head fever on and off yesterday  REVIEW OF SYSTEMS:   Review of Systems  Constitutional: Positive for fever. Negative for chills and weight loss.  HENT: Positive for sore throat. Negative for ear discharge, ear pain and nosebleeds.   Eyes: Negative for blurred vision, pain and discharge.  Respiratory: Negative for sputum production, shortness of breath, wheezing and stridor.   Cardiovascular: Negative for chest pain, palpitations, orthopnea and PND.  Gastrointestinal: Negative for nausea, vomiting, abdominal pain and diarrhea.  Genitourinary: Negative for urgency and frequency.  Musculoskeletal: Negative for back pain and joint pain.  Neurological: Negative for sensory change, speech change, focal weakness and weakness.  Psychiatric/Behavioral: Negative for depression and hallucinations. The patient is not nervous/anxious.   All other systems reviewed and are negative.  Tolerating Diet: Clear liquid Tolerating PT: Not needed  DRUG ALLERGIES:  No Known Allergies  VITALS:  Blood pressure 135/90, pulse 89, temperature 98.9 F (37.2 C), temperature source Oral, resp. rate 18, height  (1.727 m), weight 86.183 kg (190 lb), SpO2 94 %.  PHYSICAL EXAMINATION:   Physical Exam  GENERAL:  50 y.o.-year-old patient lying in the bed with no acute distress.  EYES: Pupils equal, round, reactive to light and accommodation. No scleral icterus. Extraocular muscles intact.  HEENT: Head atraumatic, normocephalic. Oropharynx and nasopharynx clear.  NECK:  Supple, no jugular venous distention. No thyroid enlargement, no tenderness.  LUNGS: Normal breath sounds bilaterally, no  wheezing, rales, rhonchi. No use of accessory muscles of respiration.  CARDIOVASCULAR: S1, S2 normal. No murmurs, rubs, or gallops.  ABDOMEN: Soft, nontender, nondistended. Bowel sounds present. No organomegaly or mass.  EXTREMITIES: No cyanosis, clubbing or edema b/l.    NEUROLOGIC: Cranial nerves II through XII are intact. No focal Motor or sensory deficits b/l.   PSYCHIATRIC:  patient is alert and oriented x 3.  SKIN: No obvious rash, lesion, or ulcer.   LABORATORY PANEL:  CBC  Recent Labs Lab 09/15/15 0426  WBC 15.2*  HGB 14.8  HCT 42.9  PLT 190    Chemistries   Recent Labs Lab 09/15/15 0426 09/16/15 0701  NA 136 142  K 3.3* 3.6  CL 103 106  CO2 23 27  GLUCOSE 146* 169*  BUN 17 20  CREATININE 0.99 0.87  CALCIUM 8.8* 8.8*  AST 43*  --   ALT 54  --   ALKPHOS 104  --   BILITOT 2.3*  --    Cardiac Enzymes No results for input(s): TROPONINI in the last 168 hours. RADIOLOGY:  Ct Soft Tissue Neck W Contrast  09/15/2015  CLINICAL DATA:  Initial evaluation for severe sore throat, hoarse flow is, neck pain, fever. EXAM: CT NECK WITH CONTRAST TECHNIQUE: Multidetector CT imaging of the neck was performed using the standard protocol following the bolus administration of intravenous contrast. CONTRAST:  75mL OMNIPAQUE IOHEXOL 300 MG/ML  SOLN COMPARISON:  None available. FINDINGS: Visualized portions of the brain are normal in appearance without acute abnormality. Partially visualized orbits within normal limits. Mild-to-moderate mucosal thickening within the max O sinuses and ethmoidal air cells, likely inflammatory in nature. Visualized mastoid air cells are clear. Middle ear  cavities are well pneumatized. Salivary glands including the parotid glands and submandibular glands within normal limits. Oral cavity demonstrates no acute abnormality. No acute abnormality about the dentition. Palatine tonsils limb cells within normal limits. Scattered calcified tonsilliths noted.  Parapharyngeal fat grossly preserved. Nasopharynx within normal limits. There is prominent asymmetric mucosal and submucosal edema throughout the right supraglottic larynx and hypopharynx, extending from the level of the epiglottis inferiorly to the level of the false cord/laryngeal ventricle. The epiglottis itself is enlarged and edematous. Right aryepiglottic fold is markedly enlarged and edematous as well. The right piriform sinus is effaced. Edema extends into the pre epiglottic fat. There is mass effect on the supraglottic airway which is narrowed and slightly shifted to the left. Findings are consistent with acute supraglottis/pharyngitis. Supraglottic airway measures approximately 7 mm at its most narrow point. Minimal mass effect on the right true cord due to the inflammatory changes. True cords are otherwise within normal limits. The subglottic airway is widely clear. No drainable fluid collection or abscess. Thyroid gland normal. Mildly enlarged right level 2 lymph nodes measure up to 14 mm, likely reactive. Right level 3 node measures at the upper limits of normal at 1 cm. Mild inflammatory stranding along the jugular chain due to lateral extension from the inflammatory process within the pharynx. Visualized superior mediastinum demonstrates no acute abnormality. Layering debris within the visualized upper esophagus. Aberrant right subclavian artery noted. Normal intravascular enhancement seen throughout the neck. No acute osseous abnormality. No worrisome lytic or blastic osseous lesion. Visualized lungs are clear. IMPRESSION: 1. Extensive edema involving the epiglottis, right aryepiglottic fold, and right supraglottic larynx and hypopharynx as above, consistent with acute supraglottis/pharyngitis. Secondary mass effect on the or pharyngeal airway which is narrowed to 7 mm at its most narrow point. No abscess or drainable fluid collection identified. 2. Mildly enlarged right-sided cervical adenopathy,  likely reactive. 3. Aberrant right subclavian artery. 4. Layering debris within the upper esophageal lumen. Finding suggests this patient may be at risk for aspiration. 5. Mild-to-moderate inflammatory paranasal sinus disease as above. Results were called by telephone at the time of interpretation on 09/15/2015 at 6:25 am to the charge nurse in the emergency room, Devoria Albe RN who verbally acknowledged these results. Electronically Signed   By: Rise Mu M.D.   On: 09/15/2015 06:28   ASSESSMENT AND PLAN:  Joseph Mckinney is a 50 y.o. 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 Patient was evaluated in the emergency room and was found to have severe right epiglottitis. H  1. Sepsis secondary to Acute right-sided severe epiglottitis -Patient came in with high-grade fever of 101.2 tachycardia and elevated white count meets criteria for sepsis -Repeat flexible sigmoidoscopy done by Dr.juengle showed improvement in right-sided epiglottitis -We'll continue IV Decadron and Unasyn for one more day -Tolerating clear liquid advancing soft diet -When necessary morphine -Strep throat negative  2. Severe dysphagia secondary to #1 Clear liquid diet advance to soft diet  3. DVT prophylaxis subcutaneous Lovenox If remains stable will discharge in the morning.  Case discussed with Care Management/Social Worker. Management plans discussed with the patient, family and they are in agreement.  CODE STATUS: Full  DVT Prophylaxis: Lovenox  TOTAL TIME TAKING CARE OF THIS PATIENT: 30 minutes.  >50% time spent on counselling and coordination of care patient and RN  POSSIBLE D/C IN one  DAYS, DEPENDING ON CLINICAL CONDITION.  Note: This dictation was prepared with Dragon dictation along with smaller phrase technology. Any transcriptional errors that  result from this process are unintentional.  Sanders Manninen M.D on 09/16/2015 at 2:27 PM  Between 7am to 6pm - Pager - [phone]  After 6pm go to [email or web address] - password [email or web address] [facility]  [facility]  Office  [phone]  CC: Primary care physician; No PCP Per Patient

## 2015-09-16 NOTE — Progress Notes (Signed)
A&O. Independent. Transferred from CCU during the night. IV unasyn given. Medicated for pain. No s/s distress. On RA.

## 2015-09-17 MED ORDER — PREDNISONE 10 MG PO TABS: ORAL_TABLET | ORAL | Status: AC

## 2015-09-17 MED ORDER — ALUM & MAG HYDROXIDE-SIMETH 200-200-20 MG/5ML PO SUSP
30.0000 mL | Freq: Four times a day (QID) | ORAL | Status: DC | PRN
  Administered 2015-09-17: 30 mL via ORAL
  Filled 2015-09-17: qty 30

## 2015-09-17 MED ORDER — AMOXICILLIN-POT CLAVULANATE 875-125 MG PO TABS: 1.0000 | ORAL_TABLET | Freq: Two times a day (BID) | ORAL | Status: AC

## 2015-09-17 MED ORDER — HYDROCODONE-ACETAMINOPHEN 5-325 MG PO TABS: 1.0000 | ORAL_TABLET | Freq: Three times a day (TID) | ORAL | Status: AC | PRN

## 2015-09-17 MED ORDER — FAMOTIDINE 20 MG PO TABS: 20.0000 mg | ORAL_TABLET | Freq: Two times a day (BID) | ORAL | Status: AC

## 2015-09-17 MED ORDER — FAMOTIDINE 20 MG PO TABS
20.0000 mg | ORAL_TABLET | Freq: Two times a day (BID) | ORAL | Status: DC
  Administered 2015-09-17: 20 mg via ORAL
  Filled 2015-09-17: qty 1

## 2015-09-17 MED ORDER — PREDNISONE 50 MG PO TABS
50.0000 mg | ORAL_TABLET | Freq: Every day | ORAL | Status: DC
  Administered 2015-09-17: 50 mg via ORAL
  Filled 2015-09-17: qty 1

## 2015-09-17 MED ORDER — DEXAMETHASONE 6 MG PO TABS: 10.0000 mg | ORAL_TABLET | Freq: Three times a day (TID) | ORAL | Status: DC

## 2015-09-17 MED ORDER — AMOXICILLIN-POT CLAVULANATE 875-125 MG PO TABS
1.0000 | ORAL_TABLET | Freq: Two times a day (BID) | ORAL | Status: DC
  Administered 2015-09-17: 1 via ORAL
  Filled 2015-09-17: qty 1

## 2015-09-17 NOTE — Discharge Summary (Addendum)
[facility] [place] [facility]   PATIENT NAME: Joseph Mckinney    MR#:  [id number]  DATE OF BIRTH:  November 18, 1965  DATE OF ADMISSION:  09/15/2015 ADMITTING PHYSICIAN: Enedina Finner, MD  DATE OF DISCHARGE: 09/17/15  PRIMARY CARE PHYSICIAN: No PCP Per Patient    ADMISSION DIAGNOSIS:  Epiglottitis [J05.10] Sepsis, due to unspecified organism (HCC) [A41.9]  DISCHARGE DIAGNOSIS:  Acute right side Epiglotitis Sepsis -resolved  SECONDARY DIAGNOSIS:  History reviewed. No pertinent past medical history.  HOSPITAL COURSE:   Joseph Mckinney is a 50 y.o. 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 Patient was evaluated in the emergency room and was found to have severe right epiglottitis. H  1. Sepsis secondary to Acute right-sided severe epiglottitis -Patient came in with high-grade fever of 101.2 tachycardia and elevated white count meets criteria for sepsis -Repeat flexible sigmoidoscopy done by Dr.juengle showed improvement in right-sided epiglottitis -recieved IV Decadron and Unasyn---change to po augmentin and prednisone taper -Tolerating  soft diet -Strep throat negative  2. Severe dysphagia secondary to #1 Clear liquid diet advance to soft diet  3. DVT prophylaxis subcutaneous Lovenox  Overall better. D/c home CONSULTS OBTAINED:  Treatment Team:  Vernie Murders, MD  DRUG ALLERGIES:  No Known Allergies  DISCHARGE MEDICATIONS:   Current Discharge Medication List    START taking these medications   Details  amoxicillin-clavulanate (AUGMENTIN) 875-125 MG tablet Take 1 tablet by mouth every 12 (twelve) hours. Qty: 14 tablet, Refills: 0    famotidine (PEPCID) 20 MG tablet Take 1 tablet (20 mg total) by mouth 2 (two) times daily. Qty: 30 tablet, Refills: 0    HYDROcodone-acetaminophen (NORCO/VICODIN)  5-325 MG tablet Take 1 tablet by mouth every 8 (eight) hours as needed for moderate pain. Qty: 30 tablet, Refills: 0    predniSONE (DELTASONE) 10 MG tablet Take 50 mg today and taper by 10 mg daily then stop Qty: 15 tablet, Refills: 0        If you experience worsening of your admission symptoms, develop shortness of breath, life threatening emergency, suicidal or homicidal thoughts you must seek medical attention immediately by calling 911 or calling your MD immediately  if symptoms less severe.  You Must read complete instructions/literature along with all the possible adverse reactions/side effects for all the Medicines you take and that have been prescribed to you. Take any new Medicines after you have completely understood and accept all the possible adverse reactions/side effects.   Please note  You were cared for by a hospitalist during your hospital stay. If you have any questions about your discharge medications or the care you received while you were in the hospital after you are discharged, you can call the unit and asked to speak with the hospitalist on call if the hospitalist that took care of you is not available. Once you are discharged, your primary care physician will handle any further medical issues. Please note that NO REFILLS for any discharge medications will be authorized once you are discharged, as it is imperative that you return to your primary care physician (or establish a relationship with a primary care physician if you do not have one) for your aftercare needs so that they can reassess your need for medications and monitor your lab values. Today   SUBJECTIVE   Doing much better. Tolerating po diet  VITAL SIGNS:  Blood pressure 143/94, pulse 96, temperature 99.2 F (37.3 C), temperature source Oral, resp. rate 18, height  (1.727 m), weight 91.1 kg (200 lb 13.4 oz), SpO2 93 %.  I/O:    Intake/Output Summary (Last 24 hours) at 09/17/15 0853 Last data  filed at 09/16/15 1838  Gross per 24 hour  Intake    440 ml  Output    600 ml  Net   -160 ml    PHYSICAL EXAMINATION:  GENERAL:  50 y.o.-year-old patient lying in the bed with no acute distress.  EYES: Pupils equal, round, reactive to light and accommodation. No scleral icterus. Extraocular muscles intact.  HEENT: Head atraumatic, normocephalic. Oropharynx and nasopharynx clear.  NECK:  Supple, no jugular venous distention. No thyroid enlargement, no tenderness.  LUNGS: Normal breath sounds bilaterally, no wheezing, rales,rhonchi or crepitation. No use of accessory muscles of respiration.  CARDIOVASCULAR: S1, S2 normal. No murmurs, rubs, or gallops.  ABDOMEN: Soft, non-tender, non-distended. Bowel sounds present. No organomegaly or mass.  EXTREMITIES: No pedal edema, cyanosis, or clubbing.  NEUROLOGIC: Cranial nerves II through XII are intact. Muscle strength 5/5 in all extremities. Sensation intact. Gait not checked.  PSYCHIATRIC: The patient is alert and oriented x 3.  SKIN: No obvious rash, lesion, or ulcer.   DATA REVIEW:   CBC   Recent Labs Lab 09/15/15 0426  WBC 15.2*  HGB 14.8  HCT 42.9  PLT 190    Chemistries   Recent Labs Lab 09/15/15 0426 09/16/15 0701  NA 136 142  K 3.3* 3.6  CL 103 106  CO2 23 27  GLUCOSE 146* 169*  BUN 17 20  CREATININE 0.99 0.87  CALCIUM 8.8* 8.8*  AST 43*  --   ALT 54  --   ALKPHOS 104  --   BILITOT 2.3*  --     Microbiology Results   Recent Results (from the past 240 hour(s))  Culture, group A strep     Status: None   Collection Time: 09/14/15 10:48 PM  Result Value Ref Range Status   Specimen Description THROAT  Final   Special Requests NONE  Final   Culture   Final    LIGHT GROWTH GROUP A STREP (S.PYOGENES) ISOLATED There is no known Penicillin Resistant Beta Streptococcus in the [place] For patients that are Penicillin-allergic, Erythromycin is 85-94% susceptible, and Clindamycin is 80% susceptible.  Contact Microbiology  within 7 days if sensitivity testing is  required.      Report Status 09/16/2015 FINAL  Final  Rapid Influenza A&B Antigens ([facility] only)     Status: None   Collection Time: 09/15/15  4:26 AM  Result Value Ref Range Status   Influenza A [facility]) NOT DETECTED  Final   Influenza B ([facility]) NOT DETECTED  Final  Blood culture (routine x 2)     Status: None (Preliminary result)   Collection Time: 09/15/15  4:26 AM  Result Value Ref Range Status   Specimen Description BLOOD LEFT HAND  Final   Special Requests BOTTLES DRAWN AEROBIC AND ANAEROBIC  Final   Culture NO GROWTH 2 DAYS  Final   Report Status PENDING  Incomplete    RADIOLOGY:  No results found.   Management plans discussed with the patient, family and they are in agreement.  CODE STATUS:     Code Status Orders        Start     Ordered   09/15/15 1045  Full code   Continuous  09/15/15 1044    Code Status History    Date Active Date Inactive Code Status Order ID Comments User Context   This patient has a current code status but no historical code status.      TOTAL TIME TAKING CARE OF THIS PATIENT: 40 minutes.    Newel Oien M.D on 09/17/2015 at 8:53 AM  Between 7am to 6pm - Pager - [phone] After 6pm go to [email or web address] - password [email or web address] [facility]  [facility]  Office  [phone]  CC: Primary care physician; No PCP Per Patient

## 2015-09-20 LAB — CULTURE, BLOOD (ROUTINE X 2): CULTURE: NO GROWTH

## 2017-10-13 IMAGING — CT CT NECK W/ CM
2 of 3 series · 8 of 14 positions shown, 9 images · IV contrast (omnipaque)
Comparison: None available.

CLINICAL DATA: Initial evaluation for severe sore throat, hoarse
flow is, neck pain, fever.

EXAM:
CT NECK WITH CONTRAST
TECHNIQUE: Multidetector CT imaging of the neck was performed using the
standard protocol following the bolus administration of intravenous
contrast.
CONTRAST:  75mL OMNIPAQUE IOHEXOL 300 MG/ML  SOLN

[Series 2: axial neck · axial · 0.60mm/px · z∈[-260,-98]mm · 4 of 136 slices shown]
[im 28/136  bone]
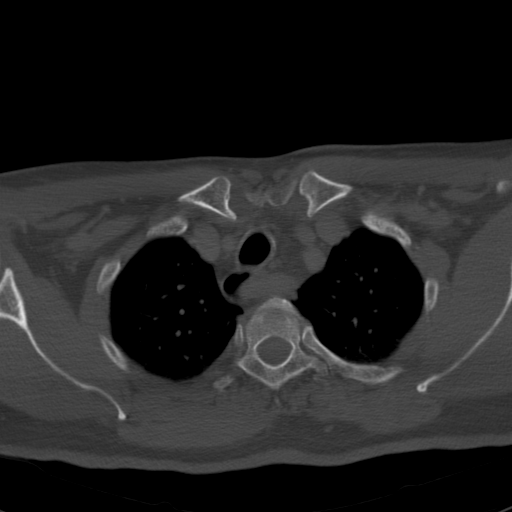
[im 55/136  bone]
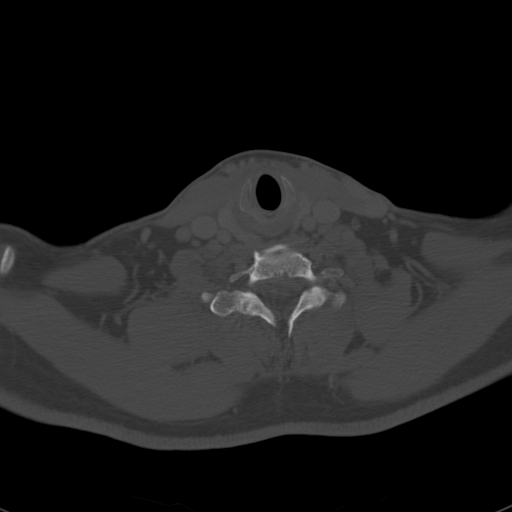
[im 82/136  bone]
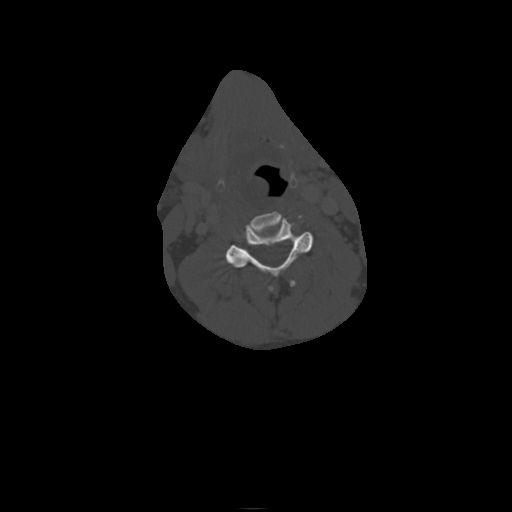
[im 109/136  bone]
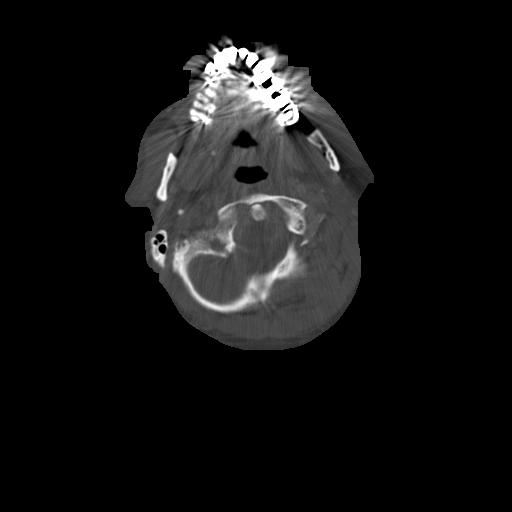

[Series 7: ax oropharynx · axial · 0.54mm/px · z∈[-289,-118]mm · 4 of 146 slices shown, 5 images]
[im 30/146  soft-tissue]
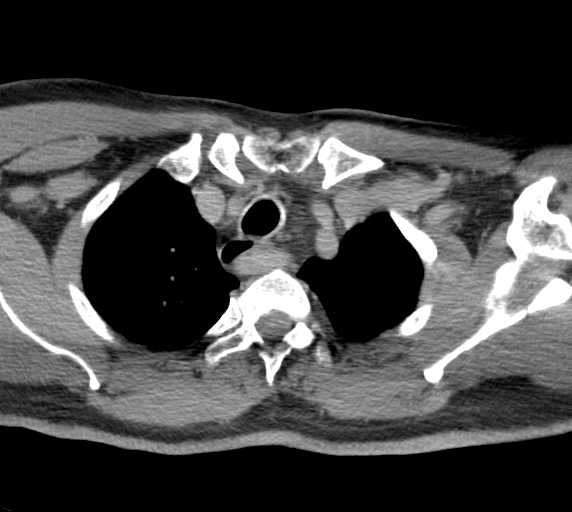
[im 30/146  bone]
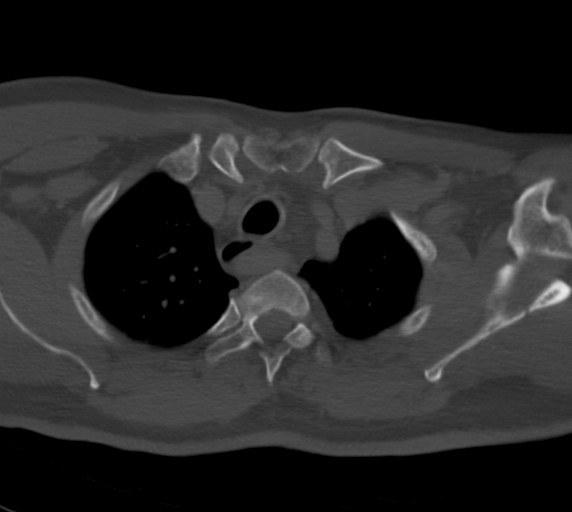
[im 59/146  bone]
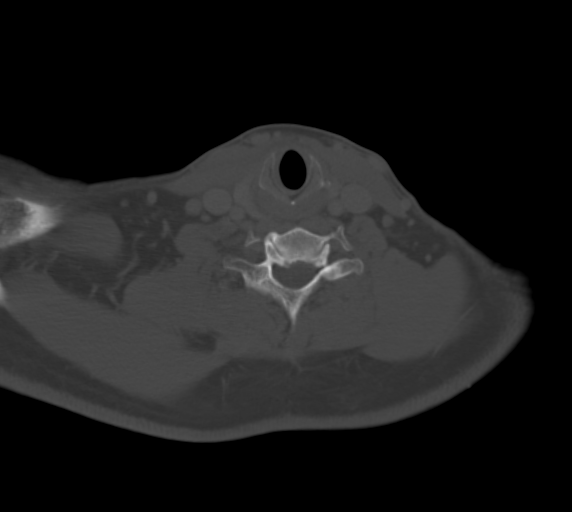
[im 88/146  bone]
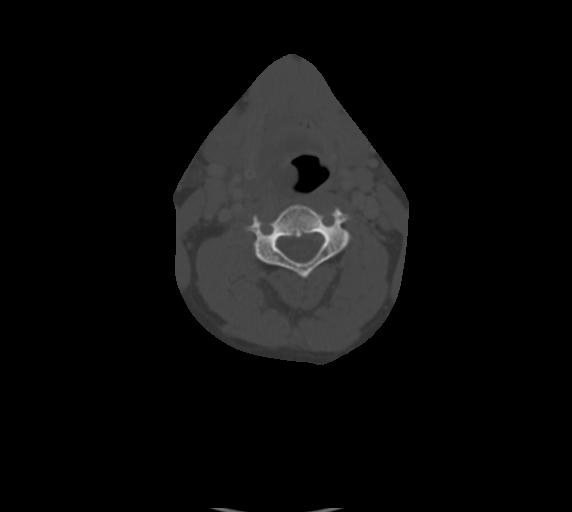
[im 117/146  bone]
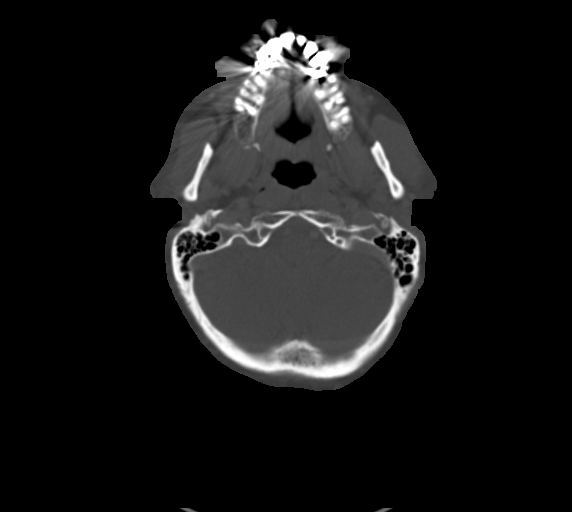

[8 of 14 positions shown; findings below may reference images not displayed]

FINDINGS: Visualized portions of the brain are normal in appearance without
acute abnormality.

Partially visualized orbits within normal limits.

Mild-to-moderate mucosal thickening within the max O sinuses and
ethmoidal air cells, likely inflammatory in nature. Visualized
mastoid air cells are clear. Middle ear cavities are well
pneumatized.

Salivary glands including the parotid glands and submandibular
glands within normal limits.

Oral cavity demonstrates no acute abnormality. No acute abnormality
about the dentition.

Palatine tonsils limb cells within normal limits. Scattered
calcified tonsilliths noted. Parapharyngeal fat grossly preserved.
Nasopharynx within normal limits.

There is prominent asymmetric mucosal and submucosal edema
throughout the right supraglottic larynx and hypopharynx, extending
from the level of the epiglottis inferiorly to the level of the
false cord/laryngeal ventricle. The epiglottis itself is enlarged
and edematous. Right aryepiglottic fold is markedly enlarged and
edematous as well. The right piriform sinus is effaced. Edema
extends into the pre epiglottic fat. There is mass effect on the
supraglottic airway which is narrowed and slightly shifted to the
left. Findings are consistent with acute supraglottis/pharyngitis.
Supraglottic airway measures approximately 7 mm at its most narrow
point. Minimal mass effect on the right true cord due to the
inflammatory changes. True cords are otherwise within normal limits.
The subglottic airway is widely clear. No drainable fluid collection
or abscess.

Thyroid gland normal.

Mildly enlarged right level 2 lymph nodes measure up to 14 mm,
likely reactive. Right level 3 node measures at the upper limits of
normal at 1 cm. Mild inflammatory stranding along the jugular chain
due to lateral extension from the inflammatory process within the
pharynx.

Visualized superior mediastinum demonstrates no acute abnormality.
Layering debris within the visualized upper esophagus. Aberrant
right subclavian artery noted.

Normal intravascular enhancement seen throughout the neck.

No acute osseous abnormality. No worrisome lytic or blastic osseous
lesion. Visualized lungs are clear.
IMPRESSION: 1. Extensive edema involving the epiglottis, right aryepiglottic
fold, and right supraglottic larynx and hypopharynx as above,
consistent with acute supraglottis/pharyngitis. Secondary mass
effect on the or pharyngeal airway which is narrowed to 7 mm at its
most narrow point. No abscess or drainable fluid collection
identified.
2. Mildly enlarged right-sided cervical adenopathy, likely reactive.
3. Aberrant right subclavian artery.
4. Layering debris within the upper esophageal lumen. Finding
suggests this patient may be at risk for aspiration.
5. Mild-to-moderate inflammatory paranasal sinus disease as above.
Results were called by telephone at the time of interpretation on
09/15/2015 at [DATE] to the charge nurse in the emergency room, Mehari
Yoshio RN who verbally acknowledged these results.
